# Patient Record
Sex: Female | Born: 1950 | Race: White | Hispanic: No | Marital: Married | Smoking: Never smoker
Health system: Southern US, Community
[De-identification: ages and names within clinical notes are randomized; demographics above are authoritative.]

## PROBLEM LIST (undated history)

## (undated) DIAGNOSIS — M797 Fibromyalgia: Secondary | ICD-10-CM

## (undated) HISTORY — PX: CHOLECYSTECTOMY: SHX55

---

## 2001-11-22 ENCOUNTER — Emergency Department (HOSPITAL_COMMUNITY): Admission: EM | Admit: 2001-11-22 | Discharge: 2001-11-22 | Payer: Self-pay | Admitting: Emergency Medicine

## 2017-02-12 ENCOUNTER — Encounter (HOSPITAL_COMMUNITY): Payer: Self-pay | Admitting: *Deleted

## 2017-02-12 ENCOUNTER — Emergency Department (HOSPITAL_COMMUNITY)
Admission: EM | Admit: 2017-02-12 | Discharge: 2017-02-12 | Disposition: A | Discharge status: Home / Self Care | Payer: Medicare (Managed Care) | Attending: Emergency Medicine | Admitting: Emergency Medicine

## 2017-02-12 ENCOUNTER — Emergency Department (HOSPITAL_COMMUNITY): Payer: Medicare (Managed Care)

## 2017-02-12 DIAGNOSIS — Y999 Unspecified external cause status: Secondary | ICD-10-CM | POA: Diagnosis not present

## 2017-02-12 DIAGNOSIS — S63502A Unspecified sprain of left wrist, initial encounter: Secondary | ICD-10-CM | POA: Diagnosis not present

## 2017-02-12 DIAGNOSIS — Y929 Unspecified place or not applicable: Secondary | ICD-10-CM | POA: Diagnosis not present

## 2017-02-12 DIAGNOSIS — S6992XA Unspecified injury of left wrist, hand and finger(s), initial encounter: Secondary | ICD-10-CM | POA: Diagnosis present

## 2017-02-12 DIAGNOSIS — W010XXA Fall on same level from slipping, tripping and stumbling without subsequent striking against object, initial encounter: Secondary | ICD-10-CM | POA: Insufficient documentation

## 2017-02-12 DIAGNOSIS — Y939 Activity, unspecified: Secondary | ICD-10-CM | POA: Insufficient documentation

## 2017-02-12 HISTORY — DX: Fibromyalgia: M79.7

## 2017-02-12 NOTE — ED Provider Notes (Signed)
[facility]-EMERGENCY DEPT Provider Note   CSN: [id number] Arrival date & time: 02/12/17  1150    By signing my name below, I, Valentino Saxon, attest that this documentation has been prepared under the direction and in the presence of Eyvonne Mechanic, PA-C Electronically Signed: Valentino Saxon, ED Scribe. 02/12/17. 12:35 PM.  History   Chief Complaint Chief Complaint  Patient presents with  . Wrist Pain  . Hand Pain   The history is provided by the patient. No language interpreter was used.     HPI Comments: Sheryl Arnold is a 66 y.o. female with PMHx of fibromyalgia who presents to the Emergency Department complaining of 8/10, moderate, constant, left wrist pain s/p fall that occurred yesterday. Pt notes she stepped onto a polished board that was on the floor, causing her to slip and fall onto her left wrist. She states her pain radiates up to her left elbow. Pt notes pain is worsened with movement. She reports taking prescribed hydrocodone at home with minimal relief. Pt notes elevating her hand at home with no relief. She denies numbness and weakness.   Past Medical History:  Diagnosis Date  . Fibromyalgia     There are no active problems to display for this patient.   Past Surgical History:  Procedure Laterality Date  . CHOLECYSTECTOMY      OB History    No data available       Home Medications    Prior to Admission medications   Not on File    Family History No family history on file.  Social History Social History  Substance Use Topics  . Smoking status: Never Smoker  . Smokeless tobacco: Never Used  . Alcohol use No     Allergies   Patient has no allergy information on record.   Review of Systems Review of Systems  A complete 10 system review of systems was obtained and all systems are negative except as noted in the HPI and PMH.   Physical Exam Updated Vital Signs BP (!) 143/87 (BP Location: Right Arm)   Pulse 77   Temp 98.3 F (36.8  C) (Oral)   Resp 18   Ht 5' 6.5" (1.689 m)   Wt 106.6 kg   SpO2 99%   BMI 37.36 kg/m   Physical Exam  Constitutional: She appears well-developed and well-nourished.  HENT:  Head: Normocephalic and atraumatic.  Eyes: Conjunctivae are normal. Right eye exhibits no discharge. Left eye exhibits no discharge.  Pulmonary/Chest: Effort normal. No respiratory distress.  Musculoskeletal:  No obvious swelling or deformities of the elbow wrist or hand.  Tenderness palpation of the proximal wrist and proximal hand, radial pulse 2+, sensation intact.  Full active range of motion of the elbow.  Neurological: She is alert. Coordination normal.  Skin: Skin is warm and dry. No rash noted. She is not diaphoretic. No erythema.  Psychiatric: She has a normal mood and affect.  Nursing note and vitals reviewed.    ED Treatments / Results   DIAGNOSTIC STUDIES: Oxygen Saturation is 96% on RA, normal by my interpretation.    COORDINATION OF CARE: 12:33 PM Discussed treatment plan with pt at bedside which includes left hand and wrist imaging and pt agreed to plan.   Labs (all labs ordered are listed, but only abnormal results are displayed) Labs Reviewed - No data to display  EKG  EKG Interpretation None       Radiology Dg Wrist Complete Left  Result Date: 02/12/2017 CLINICAL DATA:  Fall yesterday. Wrist pain and swelling. Initial encounter. EXAM: LEFT WRIST - COMPLETE 3+ VIEW COMPARISON:  None. FINDINGS: There is no evidence of fracture or dislocation. 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 No other osseous abnormality identified. IMPRESSION: No acute findings.  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 Electronically Signed   By: Myles Rosenthal M.D.   On: 02/12/2017 13:14   Dg Hand Complete Left  Result Date: 02/12/2017 CLINICAL DATA:  Fall yesterday. Hand pain and swelling. Initial encounter. EXAM: LEFT HAND - COMPLETE 3+ VIEW COMPARISON:  None. FINDINGS:  There is no evidence of fracture or dislocation. 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 Mild osteoarthritis is also seen involving the distal interphalangeal joints of the second through fifth digits. IMPRESSION: No acute findings. Osteoarthritis. Electronically Signed   By: Myles Rosenthal M.D.   On: 02/12/2017 12:55    Procedures Procedures (including critical care time)  Medications Ordered in ED Medications - No data to display   Initial Impression / Assessment and Plan / ED Course  I have reviewed the triage vital signs and the nursing notes.  Pertinent labs & imaging results that were available during my care of the patient were reviewed by me and considered in my medical decision making (see chart for details).     Labs:   Imaging: Left hand and Wrist                                                                                                                                Consults:   Therapeutics:  Discharge Meds:   Assessment/Plan: 66 year old female presents today status post fall.  Patient has no obvious deformities or signs of acute fracture on her exam.  Patient does continue to endorse snuffbox tenderness to palpation.  She will need follow-up x-ray in 1-2 weeks.  Pt verbalized understanding and agreement to today's plan had no further questions or concerns at the time of discharge.     Final Clinical Impressions(s) / ED Diagnoses   Final diagnoses:  Sprain of left wrist, initial encounter    New Prescriptions There are no discharge medications for this patient.   I personally performed the services described in this documentation, which was scribed in my presence. The recorded information has been reviewed and is accurate.   Eyvonne Mechanic, PA-C 02/12/17 1554    Alvira Monday, MD 02/13/17 [phone]

## 2017-02-12 NOTE — Discharge Instructions (Signed)
Please read attached information.  Please follow-up in 2 weeks for repeat x-ray of your hand.  Please follow-up with orthopedic specialist for further evaluation and management.  Please use Tylenol as needed for discomfort.

## 2017-02-12 NOTE — ED Triage Notes (Signed)
Pt states she tripped and fell yesterday and fell on her left wrist. Pt now complains of left wrist and hand pain. Pain radiates to elbow. Pt has been able to move wrist and fingers but states it is painful.

## 2017-10-01 IMAGING — CR DG HAND COMPLETE 3+V*L*
3 series · 3 of 3 positions shown · non-contrast
Comparison: None.

CLINICAL DATA: Fall yesterday. Hand pain and swelling. Initial
encounter.

EXAM:
LEFT HAND - COMPLETE 3+ VIEW

[x hand pa left]
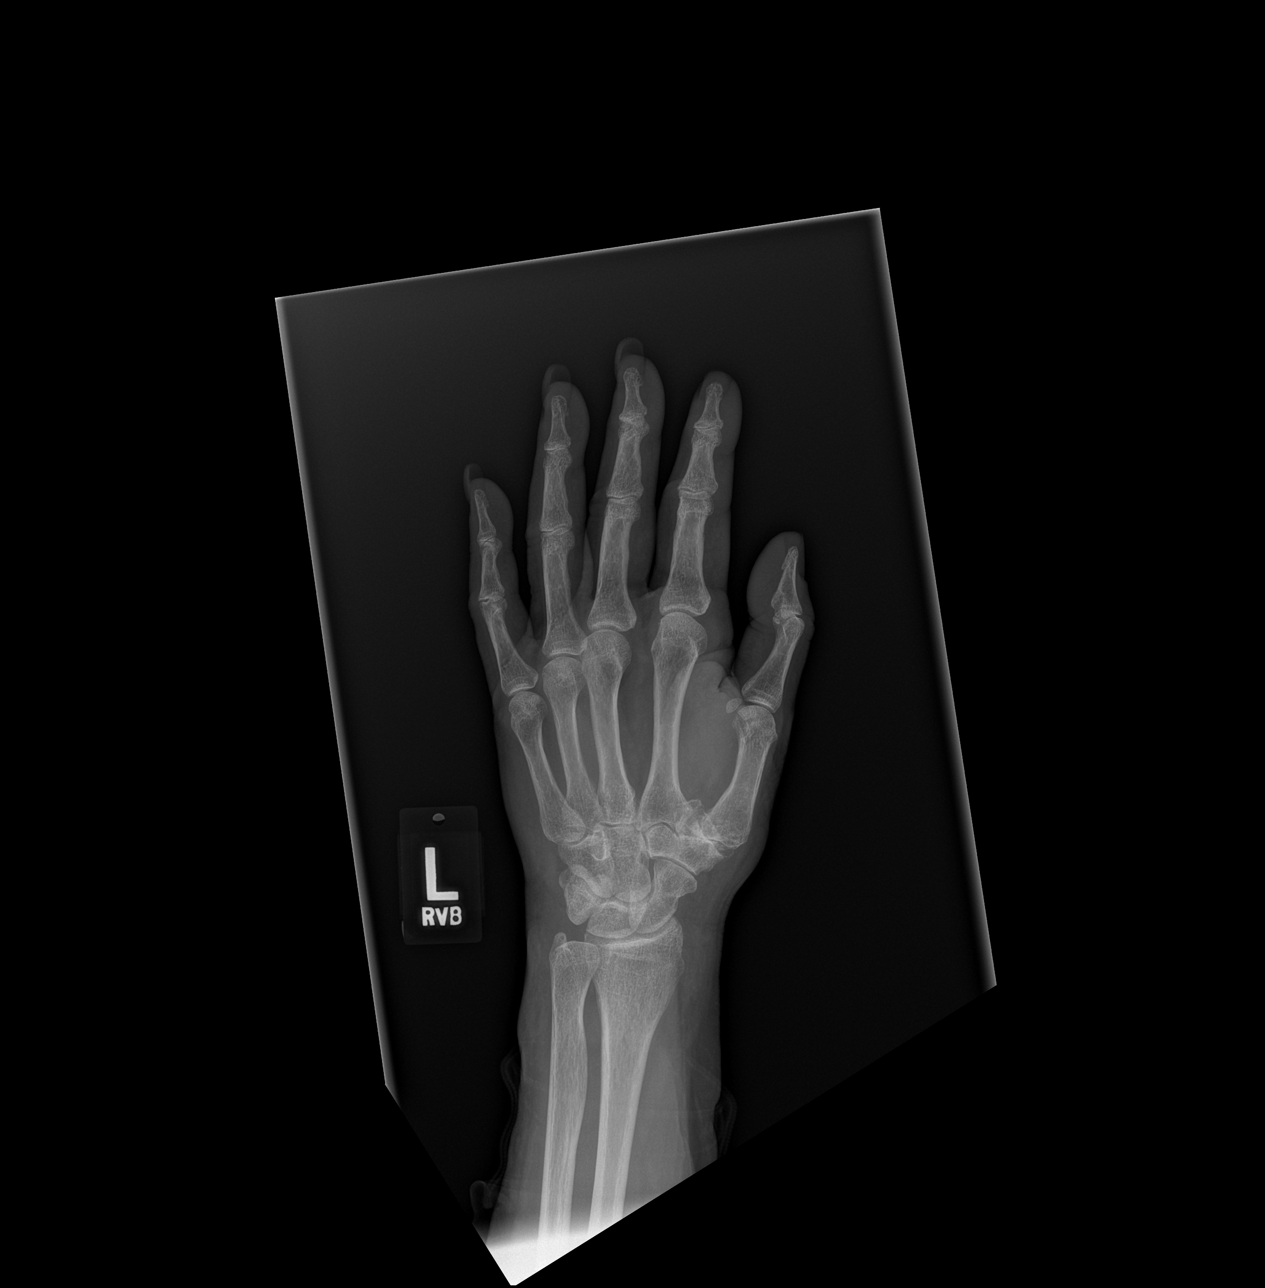

[x hand obl left]
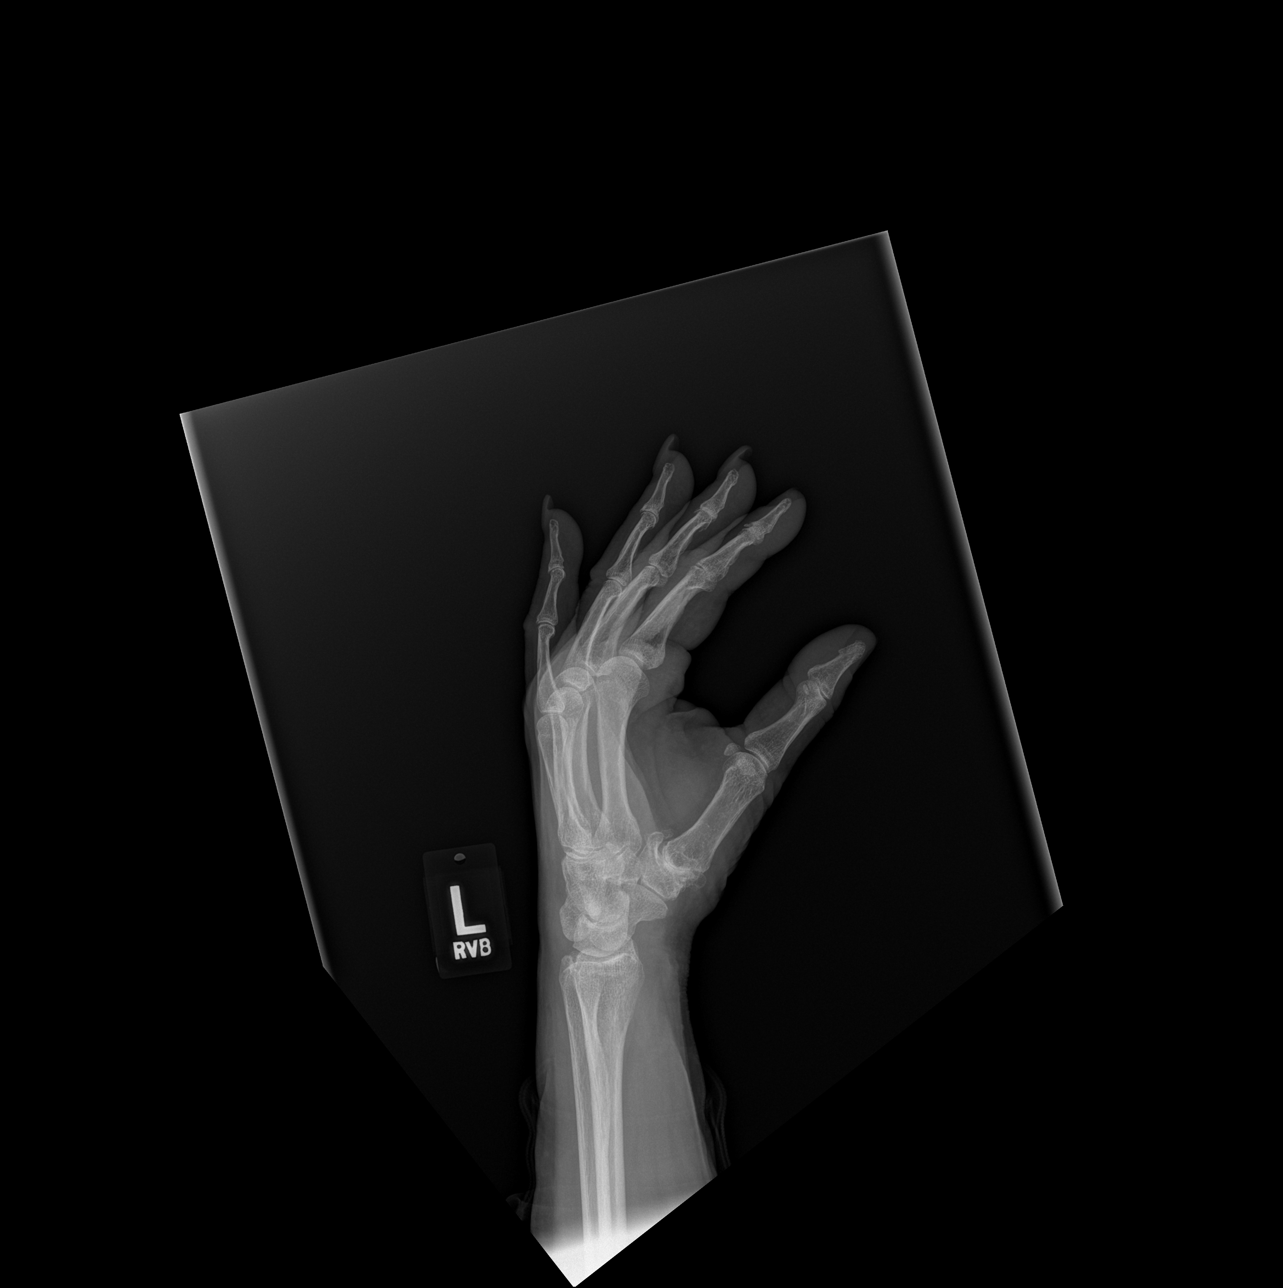

[x hand lat left]
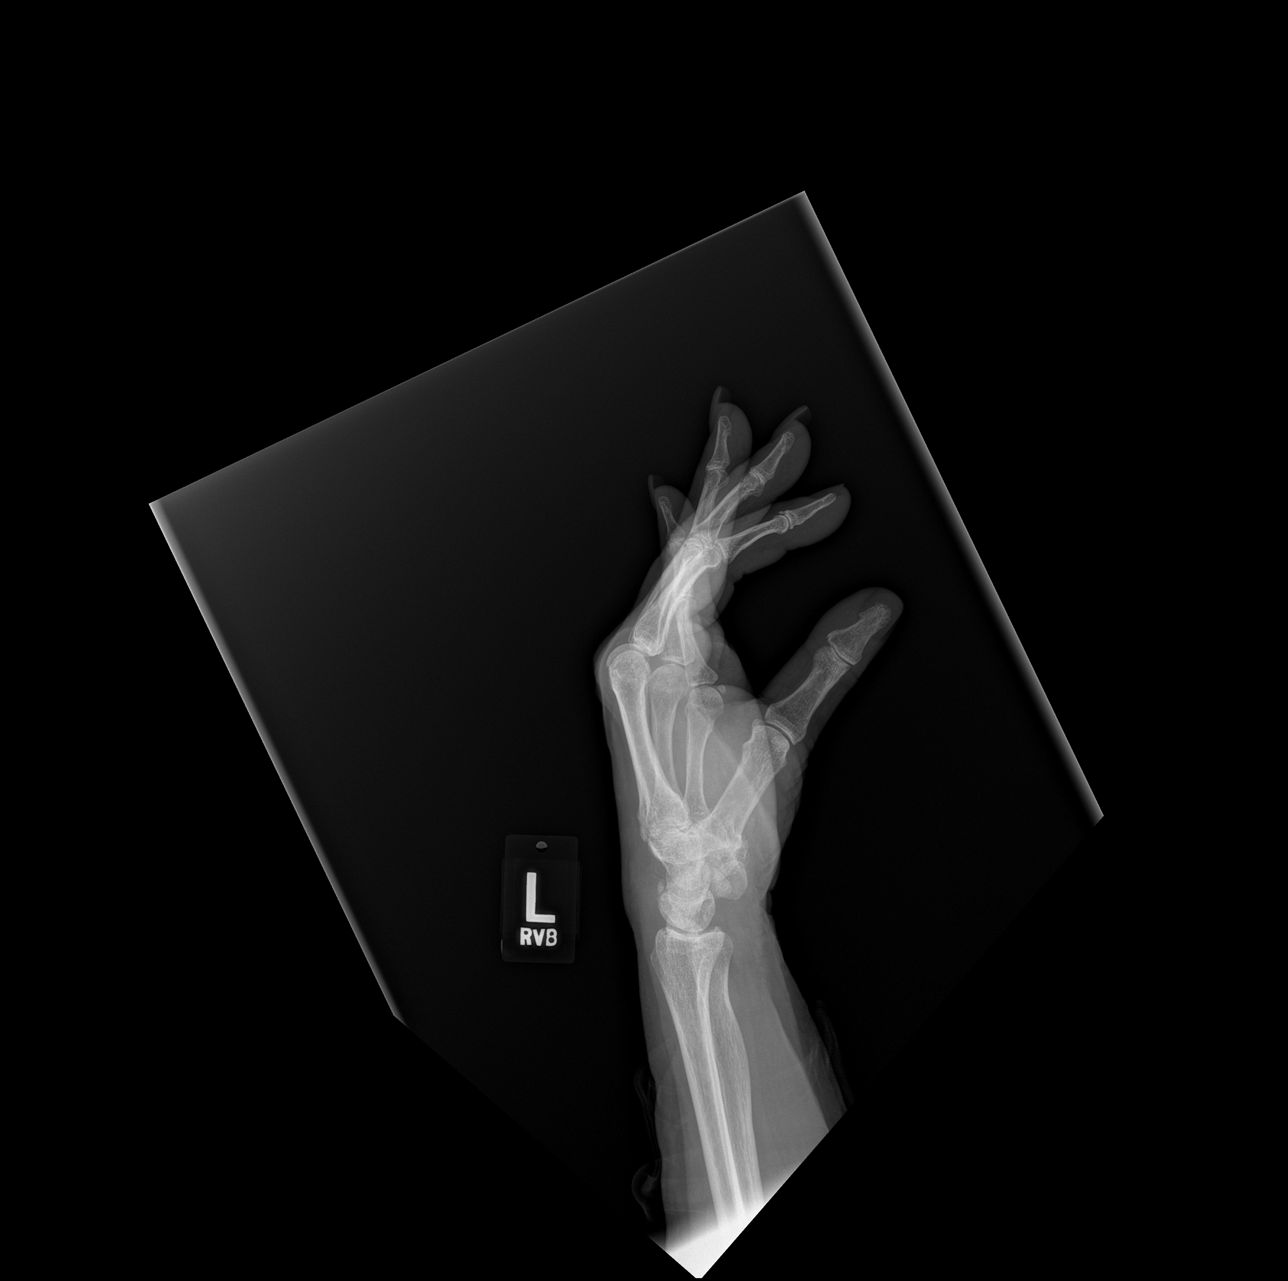

[3 of 3 positions shown; findings below may reference images not displayed]

FINDINGS: There is no evidence of fracture or dislocation. Severe
osteoarthritis is seen involving the first carpal- metacarpal joint.
Mild osteoarthritis is also seen involving the distal
interphalangeal joints of the second through fifth digits.
IMPRESSION: No acute findings.

Osteoarthritis.
